# Patient Record
Sex: Male | Born: 1960 | Hispanic: Yes | Marital: Married | Smoking: Never smoker
Health system: Southern US, Community
[De-identification: ages and names within clinical notes are randomized; demographics above are authoritative.]

---

## 2019-12-27 ENCOUNTER — Other Ambulatory Visit: Payer: Self-pay

## 2019-12-28 ENCOUNTER — Encounter: Payer: Self-pay | Admitting: Nurse Practitioner

## 2019-12-28 ENCOUNTER — Ambulatory Visit (INDEPENDENT_AMBULATORY_CARE_PROVIDER_SITE_OTHER): Payer: 59 | Admitting: Nurse Practitioner

## 2019-12-28 VITALS — BP 110/74 | HR 71 | Temp 97.1°F | Ht 65.0 in | Wt 175.2 lb

## 2019-12-28 DIAGNOSIS — Z7189 Other specified counseling: Secondary | ICD-10-CM | POA: Diagnosis not present

## 2019-12-28 DIAGNOSIS — Z87438 Personal history of other diseases of male genital organs: Secondary | ICD-10-CM | POA: Diagnosis not present

## 2019-12-28 DIAGNOSIS — N5089 Other specified disorders of the male genital organs: Secondary | ICD-10-CM | POA: Diagnosis not present

## 2019-12-28 NOTE — Progress Notes (Signed)
Subjective:  Patient ID: David Compton, male    DOB: 02-13-1961  Age: 59 y.o. MRN: [id number]  CC: Establish Care (est care--order for [place] consult--for enlarge testicle--used to go to [facility]--)   Mr. Asper is here to establish care due to change in insurance. Previous pcp with [facility], last seen 10/2019 for current complaint. He is unable to obtain US through [facility] due to insurance change.  Testicle Pain The patient's primary symptoms include testicular pain. The patient's pertinent negatives include no genital injury, genital itching, genital lesions, pelvic pain, penile discharge, penile pain or priapism. This is a new problem. The current episode started more than 1 month ago (75months). The problem occurs constantly. The problem has been unchanged. The patient is experiencing no pain. Pertinent negatives include no abdominal pain, chills, constipation, discolored urine, dysuria, hematuria, hesitancy, painful intercourse, urgency or urinary retention. The testicular pain affects the right testicle. There is swelling in the right testicle. The color of the testicles is normal. Nothing aggravates the symptoms. He has tried nothing for the symptoms. He is sexually active. He never uses condoms. No, his partner does not have an STD. His past medical history is significant for erectile dysfunction. There is no history of BPH, chlamydia, erectile aid use, a femoral hernia, gonorrhea, herpes simplex, HIV, an inguinal hernia, kidney stones, prostatitis, sickle cell disease, syphilis or varicocele.  chronic hx of ED (gets an erection but does not last long), not related to current symptoms. No FHx of cancer (prostate, colon or testicular).  Last PSA done 10/2018: normal at 0.57  Reviewed past Medical, Social and Family history today.  Outpatient Medications Prior to Visit  Medication Sig Dispense Refill  . Multiple Vitamin (MULTIVITAMIN) capsule Take by mouth.     No facility-administered medications  prior to visit.   ROS See HPI  Objective:  BP 110/74   Pulse 71   Temp (!) 97.1 F (36.2 C) (Tympanic)   Ht 5\' 5"  (1.651 m)   Wt 175 lb 3.2 oz (79.5 kg)   SpO2 98%   BMI 29.15 kg/m   BP Readings from Last 3 Encounters:  12/28/19 110/74    Wt Readings from Last 3 Encounters:  12/28/19 175 lb 3.2 oz (79.5 kg)    Physical Exam Vitals reviewed.  Cardiovascular:     Rate and Rhythm: Normal rate and regular rhythm.     Pulses: Normal pulses.     Heart sounds: Normal heart sounds.  Pulmonary:     Effort: Pulmonary effort is normal.     Breath sounds: Normal breath sounds.  Genitourinary:    Comments: Reviewed previous exam with [facility] provider 10/2019 Musculoskeletal:     Cervical back: Normal range of motion and neck supple.  Skin:    General: Skin is warm and dry.  Neurological:     Mental Status: He is alert and oriented to person, place, and time.     Assessment & Plan:  This visit occurred during the SARS-CoV-2 public health emergency.  Safety protocols were in place, including screening questions prior to the visit, additional usage of staff PPE, and extensive cleaning of exam room while observing appropriate contact time as indicated for disinfecting solutions.   Alija was seen today for establish care.  Diagnoses and all orders for this visit:  Mass of right testicle -     Norva Pavlov SCROTUM W/DOPPLER; Future  Advanced directives, counseling/discussion  Hx of erectile dysfunction   I am having [name] maintain his multivitamin.  No orders of the defined types were placed in this encounter.   Problem List Items Addressed This Visit      Other   Advanced directives, counseling/discussion   Hx of erectile dysfunction   Mass of right testicle - Primary   Relevant Orders   US SCROTUM W/DOPPLER      Follow-up: Return in about 3 months (around 03/26/2020) for CPE (fasting).  Wilfred Lacy, NP

## 2019-12-28 NOTE — Patient Instructions (Signed)
Thank you for choosing [facility] for your health needs  You will be contacted to schedule appt for scrotal US.  Ask your insurance about Cologuard coverage.

## 2020-01-24 ENCOUNTER — Ambulatory Visit
Admission: RE | Admit: 2020-01-24 | Discharge: 2020-01-24 | Disposition: A | Discharge status: Home / Self Care | Payer: 59 | Source: Ambulatory Visit | Attending: Nurse Practitioner | Admitting: Nurse Practitioner

## 2020-01-24 DIAGNOSIS — N5089 Other specified disorders of the male genital organs: Secondary | ICD-10-CM

## 2020-02-08 ENCOUNTER — Telehealth: Payer: Self-pay | Admitting: Nurse Practitioner

## 2020-02-08 DIAGNOSIS — N5089 Other specified disorders of the male genital organs: Secondary | ICD-10-CM

## 2020-02-08 NOTE — Telephone Encounter (Signed)
Patient is calling back for imaging results. CB is [phone]

## 2020-02-09 NOTE — Telephone Encounter (Signed)
Pt notified and understood another [place] is ordered.

## 2020-02-09 NOTE — Telephone Encounter (Signed)
Charlotte please advise.   Pt called for imaging results on his testicular US. Pt was confused it had been almost a month. Pt worried they seen something of concern.

## 2020-02-09 NOTE — Telephone Encounter (Signed)
Charlotte please advise.   [facility] stated patient came in 01/24/2020 and had an ultrasound but wasn't aware the patient needed anymore imaging done because she didn't see any other order for anything else to be done.

## 2020-02-09 NOTE — Telephone Encounter (Signed)
Please inform patient that I ordered another [place]

## 2020-02-09 NOTE — Telephone Encounter (Signed)
01/24/2020 [place] results indicates need to repeat due additional eval of cyst. Imaging center was to call and schedule this. Please call them and inquire why he was not contacted?

## 2020-02-16 ENCOUNTER — Ambulatory Visit
Admission: RE | Admit: 2020-02-16 | Discharge: 2020-02-16 | Disposition: A | Discharge status: Home / Self Care | Payer: 59 | Source: Ambulatory Visit | Attending: Nurse Practitioner | Admitting: Nurse Practitioner

## 2020-02-16 ENCOUNTER — Other Ambulatory Visit: Payer: Self-pay | Admitting: Nurse Practitioner

## 2020-02-16 DIAGNOSIS — N5089 Other specified disorders of the male genital organs: Secondary | ICD-10-CM

## 2020-02-28 ENCOUNTER — Ambulatory Visit
Admission: RE | Admit: 2020-02-28 | Discharge: 2020-02-28 | Disposition: A | Discharge status: Home / Self Care | Payer: 59 | Source: Ambulatory Visit | Attending: Nurse Practitioner | Admitting: Nurse Practitioner

## 2020-02-28 ENCOUNTER — Other Ambulatory Visit: Payer: 59

## 2020-02-28 DIAGNOSIS — N5089 Other specified disorders of the male genital organs: Secondary | ICD-10-CM

## 2020-03-27 ENCOUNTER — Encounter: Payer: 59 | Admitting: Nurse Practitioner

## 2021-06-28 IMAGING — US US SCROTUM W/ DOPPLER COMPLETE
2 series · 13 of 25 positions shown · non-contrast
Comparison: None
COMPARISON: None

Addendum:
CLINICAL DATA: Right testicular mass

EXAM:
SCROTAL ULTRASOUND
DOPPLER ULTRASOUND OF THE TESTICLES
TECHNIQUE: Complete ultrasound examination of the testicles, epididymis, and
other scrotal structures was performed. Color and spectral Doppler
ultrasound were also utilized to evaluate blood flow to the
testicles.

[Series 1: us scrotum w/ doppler complete · 0.06mm/px · 4 of 24 slices shown (1 of 2)]
[im 1/24]
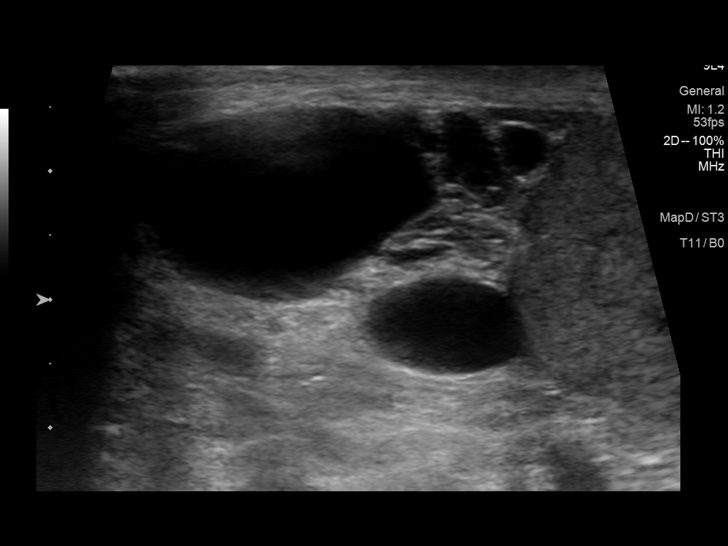
[im 8/24]
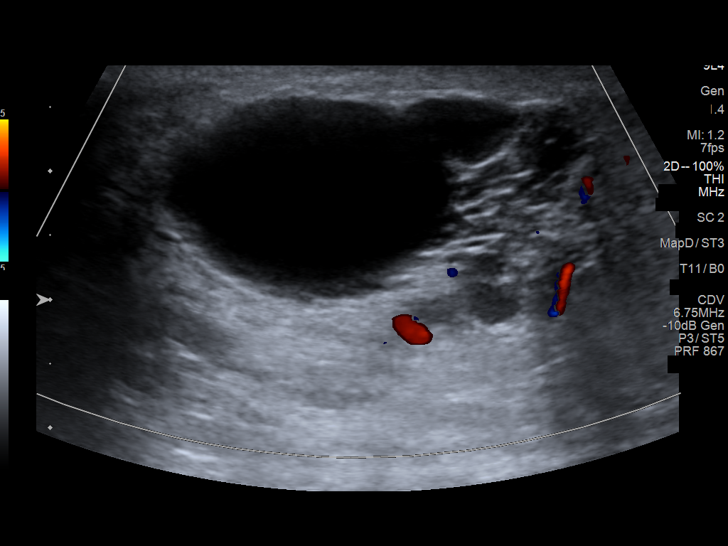
[im 16/24]
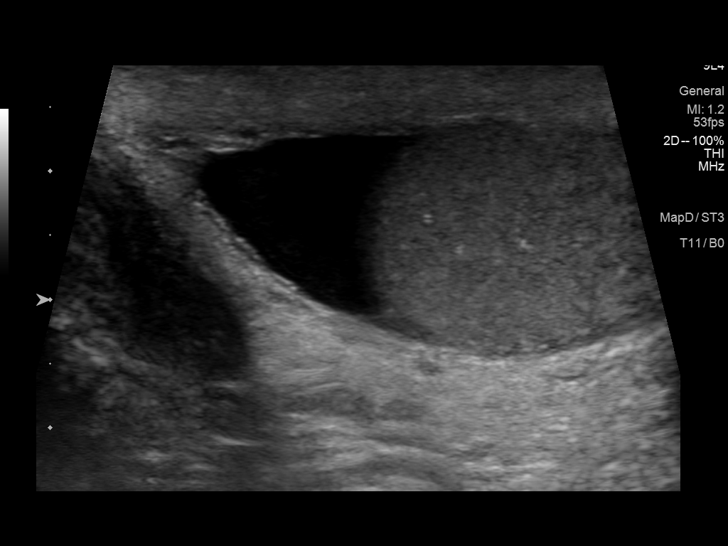
[im 24/24]
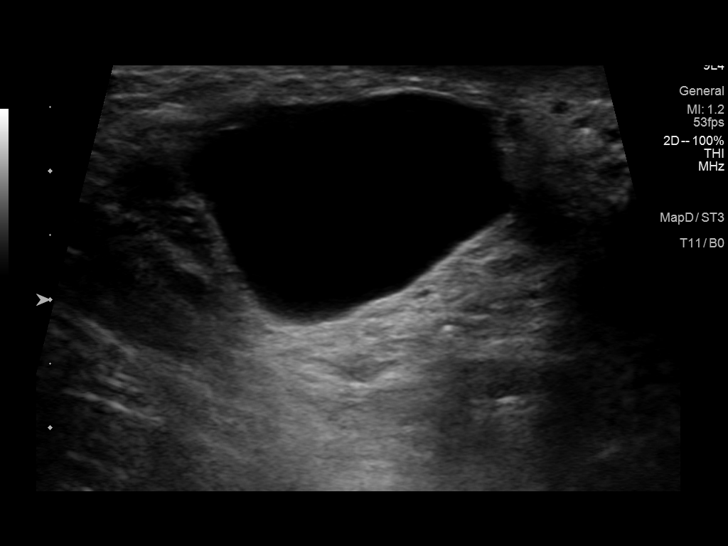

[Series 1: us scrotum w/ doppler complete · 0.06mm/px · 9 of 56 slices shown (2 of 2)]
[im 4/56]
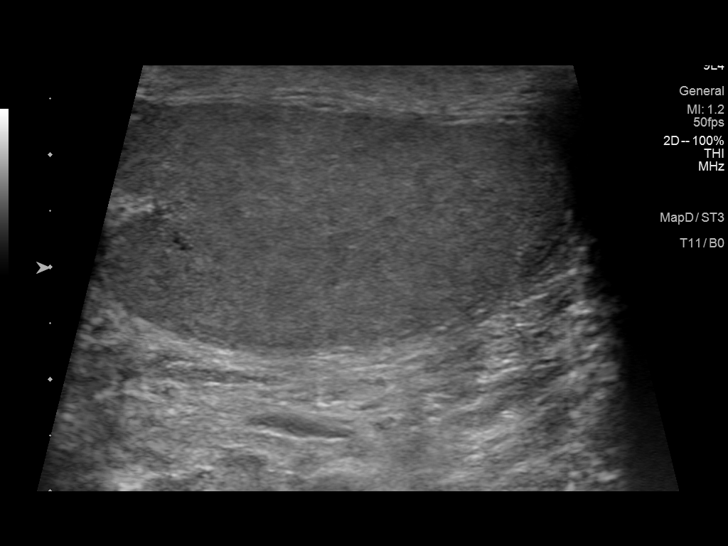
[im 10/56]
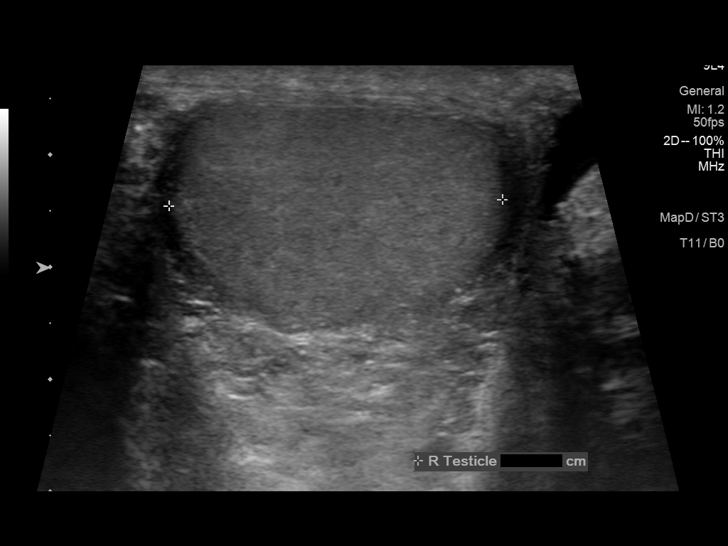
[im 17/56]
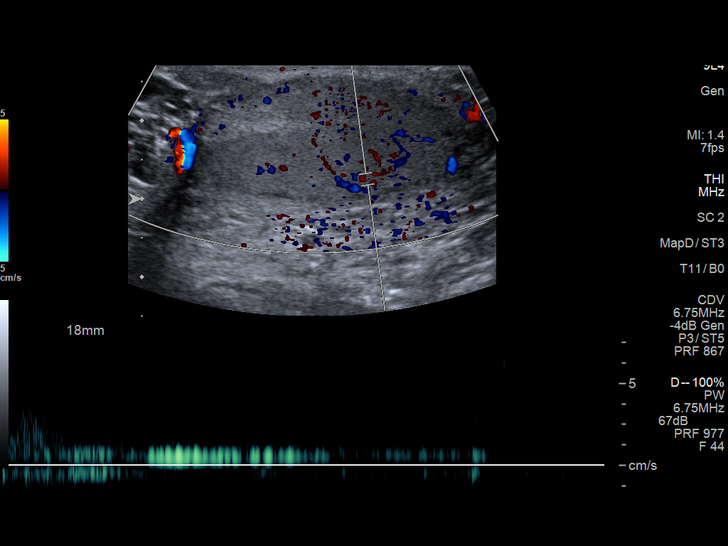
[im 23/56]
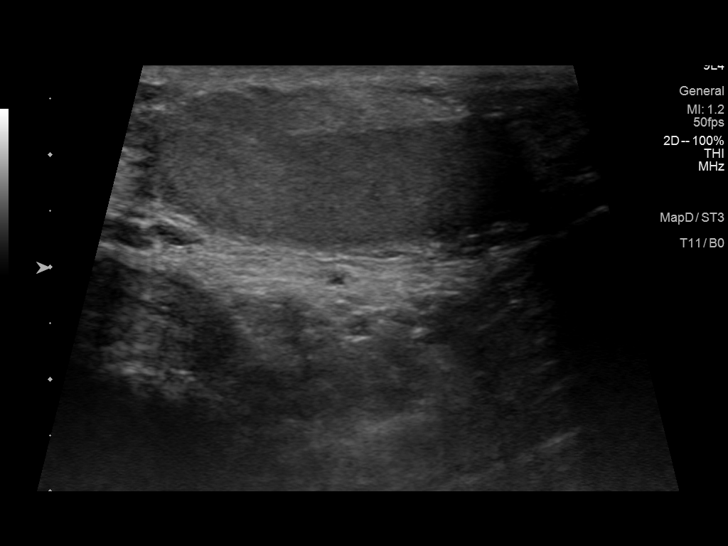
[im 30/56]
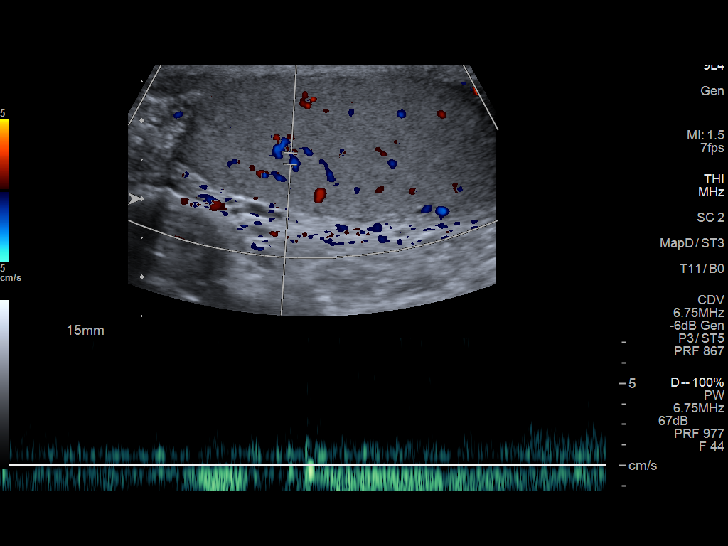
[im 36/56]
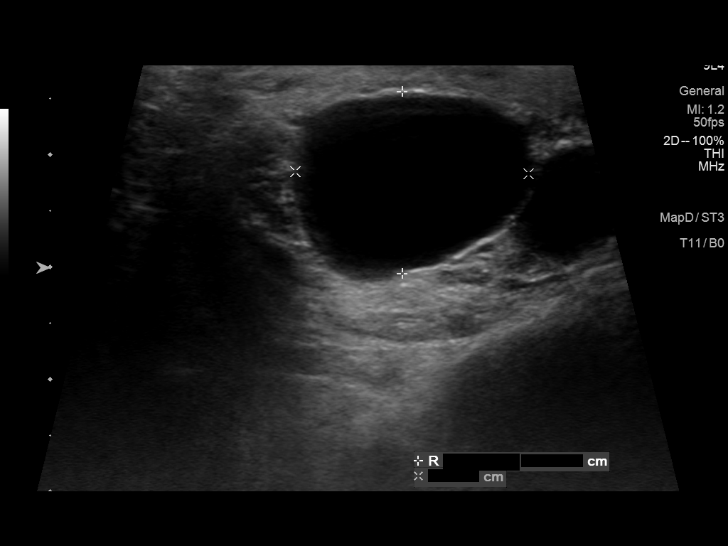
[im 43/56]
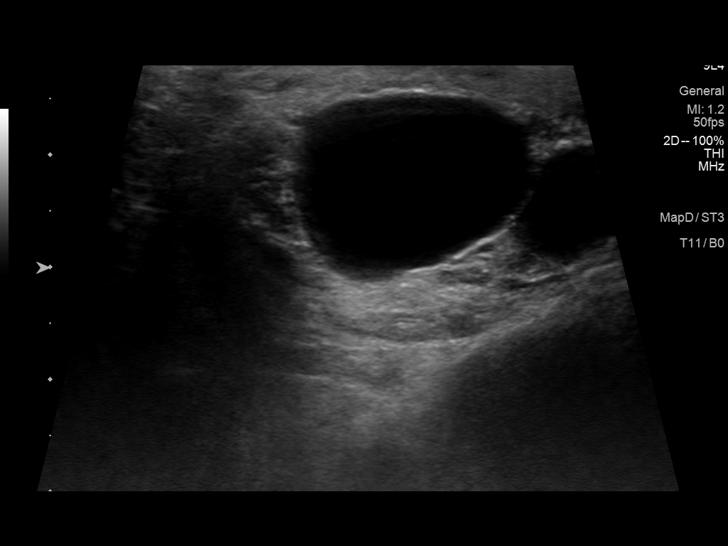
[im 49/56]
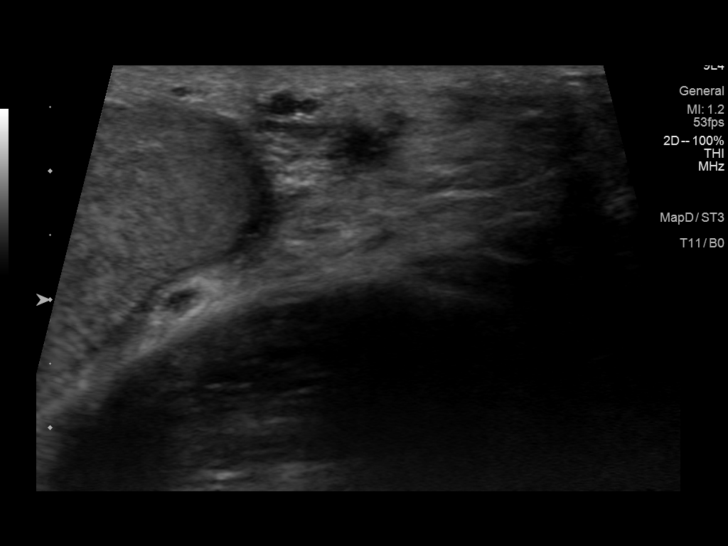
[im 56/56]
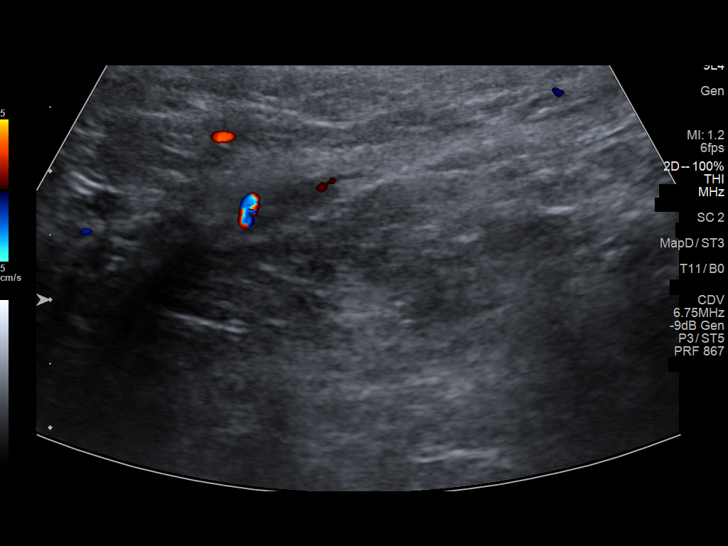

[13 of 25 positions shown; findings below may reference images not displayed]

FINDINGS: Right testicle

Measurements: 4.4 x 2.2 x 3.0 cm. No mass or microlithiasis
visualized.

Left testicle

Measurements: 4.2 x 2.0 x 3.1 cm. No mass or microlithiasis
visualized.

Right epididymis: Cystic areas in the head of the right epididymis,
largest measuring 2.2 x 1.6 x 2.1 cm

The smaller area measuring 2.0 x 1.1 x 1.0 cm.

Left epididymis:  Normal in size and appearance.

Hydrocele:  None visualized.

Varicocele:  None visualized.

Pulsed Doppler interrogation of both testes demonstrates normal low
resistance arterial and venous waveforms bilaterally.
IMPRESSION: 1. Water almost certainly epididymal cysts. However, color Doppler
was not performed over these areas on the current exam. The patient
will return for color Doppler imaging to exclude any vascular
malformation in the area as an alternative cause for this
appearance.
2. No intratesticular mass is visualized.

ADDENDUM:
Large right epididymal cysts as outlined in the initial report.
Color Doppler does not show flow within these areas. Confirming
cystic nature.

*** End of Addendum ***
FINDINGS: Right testicle

Measurements: 4.4 x 2.2 x 3.0 cm. No mass or microlithiasis
visualized.

Left testicle

Measurements: 4.2 x 2.0 x 3.1 cm. No mass or microlithiasis
visualized.

Right epididymis: Cystic areas in the head of the right epididymis,
largest measuring 2.2 x 1.6 x 2.1 cm

The smaller area measuring 2.0 x 1.1 x 1.0 cm.

Left epididymis:  Normal in size and appearance.

Hydrocele:  None visualized.

Varicocele:  None visualized.

Pulsed Doppler interrogation of both testes demonstrates normal low
resistance arterial and venous waveforms bilaterally.
IMPRESSION: 1. Water almost certainly epididymal cysts. However, color Doppler
was not performed over these areas on the current exam. The patient
will return for color Doppler imaging to exclude any vascular
malformation in the area as an alternative cause for this
appearance.
2. No intratesticular mass is visualized.
